# Patient Record
Sex: Female | Born: 1975
Health system: Southern US, Community
[De-identification: ages and names within clinical notes are randomized; demographics above are authoritative.]

## PROBLEM LIST (undated history)

## (undated) DIAGNOSIS — J310 Chronic rhinitis: Secondary | ICD-10-CM

## (undated) DIAGNOSIS — F319 Bipolar disorder, unspecified: Secondary | ICD-10-CM

## (undated) HISTORY — PX: TUBAL LIGATION: SHX77

## (undated) HISTORY — PX: THYROIDECTOMY, PARTIAL: SHX18

## (undated) HISTORY — DX: Bipolar disorder, unspecified: F31.9

## (undated) HISTORY — DX: Chronic rhinitis: J31.0

---

## 2004-07-08 ENCOUNTER — Inpatient Hospital Stay (HOSPITAL_COMMUNITY): Admission: AD | Admit: 2004-07-08 | Discharge: 2004-07-11 | Payer: Self-pay | Admitting: Obstetrics and Gynecology

## 2004-08-09 ENCOUNTER — Other Ambulatory Visit: Admission: RE | Admit: 2004-08-09 | Discharge: 2004-08-09 | Payer: Self-pay | Admitting: Obstetrics and Gynecology

## 2005-10-12 ENCOUNTER — Other Ambulatory Visit: Admission: RE | Admit: 2005-10-12 | Discharge: 2005-10-12 | Payer: Self-pay | Admitting: Obstetrics and Gynecology

## 2005-12-26 ENCOUNTER — Other Ambulatory Visit: Admission: RE | Admit: 2005-12-26 | Discharge: 2005-12-26 | Payer: Self-pay | Admitting: Endocrinology

## 2006-12-27 ENCOUNTER — Other Ambulatory Visit: Admission: RE | Admit: 2006-12-27 | Discharge: 2006-12-27 | Payer: Self-pay | Admitting: Obstetrics and Gynecology

## 2007-08-23 ENCOUNTER — Encounter: Admission: RE | Admit: 2007-08-23 | Discharge: 2007-08-23 | Payer: Self-pay | Admitting: Endocrinology

## 2007-09-17 ENCOUNTER — Encounter: Admission: RE | Admit: 2007-09-17 | Discharge: 2007-09-17 | Payer: Self-pay | Admitting: Obstetrics and Gynecology

## 2010-07-01 ENCOUNTER — Encounter: Payer: Self-pay | Admitting: Internal Medicine

## 2010-07-01 ENCOUNTER — Institutional Professional Consult (permissible substitution) (INDEPENDENT_AMBULATORY_CARE_PROVIDER_SITE_OTHER): Payer: BC Managed Care – PPO | Admitting: Internal Medicine

## 2010-07-01 DIAGNOSIS — J45909 Unspecified asthma, uncomplicated: Secondary | ICD-10-CM

## 2010-07-12 NOTE — Assessment & Plan Note (Signed)
Summary: CONSULT FOR ASTHMA//SH   Copy to:  Self Primary Provider/Referring Provider:  Ehinger  CC:  Dsypnea and Cough.  History of Present Illness: July 01, 2010- 35 yoF here with husband, concerned about persistent cough x 6 months. She describes raspy, nonproductive cough with chest tightness. It began with an acute infection 'chest cold", but never cleared. In November she saw Dr Manus Gunning who gave a rescue inhaler and antibiotic without clear benefit at that time. The inhaler now does seem to help some and is used at least once daily. The inhaler may have helped temporarily. She has some sense of chest congestion but not wheeze. Mucinex and loratadine did not help. Ears and nose ok. Dust and mold exposures might make cough a little worse. She is not noticing postnasal drainage or reflux/ heartburn, or choke during sleep or meals.  Hx asthma and allergic rhinitis as a child. Has lived here 6 years. While in [place] about 5 years ago, sublingual immunotherapy did help rhinitis with sensitivity to dyst and mold. Dog exposure seemed to make her wolrse and may have been included in the vaccine.  Her dog died 1 year ago. Minor Spring rhinitis. Denies dyspsnea and feels she has good exercise tolerance.   Asthma History    Initial Asthma Severity Rating:    Age range: 12+ years    Symptoms: daily    Nighttime Awakenings: 0-2/month    Interferes w/ normal activity: minor limitations    SABA use (not for EIB): daily    Asthma Severity Assessment: Moderate Persistent   Preventive Screening-Counseling & Management  Alcohol-Tobacco     Smoking Status: quit     Packs/Day: 0.5     Year Started: college     Year Quit: 2000  Current Medications (verified): 1)  Lamictal 100 Mg Tabs (Lamotrigine) .Marland Kitchen.. 1 Two Times A Day 2)  Seroquel Xr 50 Mg Xr24h-Tab (Quetiapine Fumarate) .... 1/2 Every Am As Needed and 1 At Bedtime 3)  Zoloft 100 Mg Tabs (Sertraline Hcl) .Marland Kitchen.. 1 Once Daily 4)  Clonazepam 0.5 Mg Tabs  (Clonazepam) .Marland Kitchen.. 1 Two Times A Day 5)  Proair Hfa 108 (90 Base) Mcg/act Aers (Albuterol Sulfate) .... 2 Puffs Four Times A Day As Needed  Allergies (verified): No Known Drug Allergies  Past History:  Family History: Last updated: 07/01/2010 Emphysema- PGF (smoker) Allergies- Mother Asthma- Mother and Father RA- Father Lung CA-   Social History: Last updated: 07/01/2010 Married Children Former smoker. Quit in 2000.   Auctioneer and Liquidation specialist   Risk Factors: Smoking Status: quit (07/01/2010) Packs/Day: 0.5 (07/01/2010)  Past Medical History: Asthma Rhinitis BiPolar  Past Surgical History: Partial Thyroidectomy  Family History: Emphysema- PGF (smoker) Allergies- Mother Asthma- Mother and Father RA- Father Lung CA-   Social History: Married Children Former smoker. Quit in 2000.   Auctioneer and Liquidation specialist Packs/Day:  0.5 Smoking Status:  quit  Review of Systems      See HPI       The patient complains of shortness of breath with activity, non-productive cough, irregular heartbeats, sore throat, anxiety, and depression.  The patient denies shortness of breath at rest, productive cough, coughing up blood, chest pain, acid heartburn, indigestion, loss of appetite, weight change, abdominal pain, difficulty swallowing, tooth/dental problems, headaches, nasal congestion/difficulty breathing through nose, sneezing, itching, ear ache, hand/feet swelling, joint stiffness or pain, rash, change in color of mucus, and fever.    Vital Signs:  Patient profile:   35 year old female  Height:      64 inches Weight:      133 pounds BMI:     22.91 O2 Sat:      99 % on Room air Pulse rate:   80 / minute BP sitting:   108 / 72  (left arm)  Vitals Entered By: Vernie Murders (July 01, 2010 10:37 AM)  O2 Flow:  Room air  Physical Exam  Additional Exam:  General: A/Ox3; pleasant and cooperative, NAD, wdwn SKIN: no rash, lesions NODES: no  lymphadenopathy HEENT: [place]/AT, EOM- WNL, Conjuctivae- clear, PERRLA, TM-WNL, Nose- clear, Throat- clear and wnl, Mallampati  II NECK: Supple w/ fair ROM, JVD- none, normal carotid impulses w/o bruits Thyroid- normal to palpation CHEST: Clear to P&A, no cough, wheeze, rhonchi or increased work of breathing HEART: RRR, no m/g/r heard ABDOMEN: Soft and nl; nml bowel sounds; no organomegaly or masses noted ZHY:QMVH, nl pulses, no edema  NEURO: Grossly intact to observation      Impression & Recommendations:  Problem # 1:  ASTHMA (ICD-493.90) We will try to give her a fast fix with a Zpak and sample Symbicort, chosen for anitinflammatory effect.  This has lasted longer than the usual post-viral bronchits. We aren't yet sure if this is part of a longer trend with resumption of her asthma., or a persistent but correctable temporary issue.   Time was spent with her and her husband to discuss differential considerations of sustaining rhinitis, reflux or asthma, and to compare treatment and diagnostic options.   Medications Added to Medication List This Visit: 1)  Lamictal 100 Mg Tabs (Lamotrigine) .Marland Kitchen.. 1 two times a day 2)  Seroquel Xr 50 Mg Xr24h-tab (Quetiapine fumarate) .... 1/2 every am as needed and 1 at bedtime 3)  Zoloft 100 Mg Tabs (Sertraline hcl) .Marland Kitchen.. 1 once daily 4)  Clonazepam 0.5 Mg Tabs (Clonazepam) .Marland Kitchen.. 1 two times a day 5)  Proair Hfa 108 (90 Base) Mcg/act Aers (Albuterol sulfate) .... 2 puffs four times a day as needed 6)  Symbicort 160-4.5 Mcg/act Aero (Budesonide-formoterol fumarate) .... 2 puffs and rinse mouth, twice daily 7)  Zithromax Z-pak 250 Mg Tabs (Azithromycin) .... 2 today then one daily  Other Orders: Consultation Level IV (84696)  Patient Instructions: 1)  Please schedule a follow-up appointment in 1 month. 2)  Sample/ script Symbicort 160-4.5 3)      2 puffs and rinse mouth, twice daily 4)  Script Z pak  5)  Refill script Proair Prescriptions: ZITHROMAX  Z-PAK 250 MG TABS (AZITHROMYCIN) 2 today then one daily  #1 pak x 0   Entered and Authorized by:   Waymon Budge MD   Signed by:   Waymon Budge MD on 07/01/2010   Method used:   Print then Give to Patient   RxID:   [id number] PROAIR HFA 108 (90 BASE) MCG/ACT AERS (ALBUTEROL SULFATE) 2 puffs four times a day as needed  #1 x prn   Entered and Authorized by:   Waymon Budge MD   Signed by:   Waymon Budge MD on 07/01/2010   Method used:   Print then Give to Patient   RxID:   [id number] SYMBICORT 160-4.5 MCG/ACT AERO (BUDESONIDE-FORMOTEROL FUMARATE) 2 puffs and rinse mouth, twice daily  #1 x prn   Entered and Authorized by:   Waymon Budge MD   Signed by:   Waymon Budge MD on 07/01/2010   Method used:   Print then Give to Patient  RxID:   [id number]

## 2010-07-12 NOTE — Medication Information (Signed)
Summary: Request for Z-pack/[facility]  Request for Z-pack/[facility]   Imported By: Sherian Rein 07/07/2010 07:25:35  _____________________________________________________________________  External Attachment:    Type:   Image     Comment:   External Document

## 2010-08-10 ENCOUNTER — Encounter: Payer: Self-pay | Admitting: Internal Medicine

## 2010-08-12 ENCOUNTER — Encounter: Payer: Self-pay | Admitting: Internal Medicine

## 2010-08-12 ENCOUNTER — Ambulatory Visit (INDEPENDENT_AMBULATORY_CARE_PROVIDER_SITE_OTHER): Payer: BC Managed Care – PPO | Admitting: Internal Medicine

## 2010-08-12 VITALS — BP 120/90 | HR 102 | Ht 64.0 in | Wt 128.6 lb

## 2010-08-12 DIAGNOSIS — J45909 Unspecified asthma, uncomplicated: Secondary | ICD-10-CM

## 2010-08-12 MED ORDER — METHYLPREDNISOLONE ACETATE 40 MG/ML IJ SUSP
40.0000 mg | Freq: Once | INTRAMUSCULAR | Status: AC
  Administered 2010-08-12: 40 mg via INTRAMUSCULAR

## 2010-08-12 MED ORDER — LEVALBUTEROL HCL 0.63 MG/3ML IN NEBU
0.6300 mg | INHALATION_SOLUTION | Freq: Once | RESPIRATORY_TRACT | Status: AC
  Administered 2010-08-12: 0.63 mg via RESPIRATORY_TRACT

## 2010-08-12 MED ORDER — BUDESONIDE-FORMOTEROL FUMARATE 80-4.5 MCG/ACT IN AERO: 2.0000 | INHALATION_SPRAY | Freq: Two times a day (BID) | RESPIRATORY_TRACT | Status: DC

## 2010-08-12 MED ORDER — IPRATROPIUM BROMIDE HFA 17 MCG/ACT IN AERS: 2.0000 | INHALATION_SPRAY | Freq: Four times a day (QID) | RESPIRATORY_TRACT | Status: AC

## 2010-08-12 NOTE — Progress Notes (Signed)
  Subjective:    Patient ID: Elizabeth Carey, female    DOB: [birth year], 34 y.o.   MRN: [id number]  HPI 21 yoF former smoker, followed for asthma. Last here July 01, 2010 for initial visit. 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 In last few days chest is more congested, cough more. Daughter has a viral syndrome. Proair over stimulates. She feels under work pressure, and I gather her anxiety about job performance is important to her sense of asthma control at this time. .    Review of Systems See HPI Constitutional:   No weight loss, night sweats,  Fevers, chills, fatigue, lassitude. HEENT:   No headaches,  Difficulty swallowing,  Tooth/dental problems,  Sore throat,                No sneezing, itching, ear ache, nasal congestion, post nasal drip,   CV:  No chest pain,  Orthopnea, PND, swelling in lower extremities, anasarca, dizziness, palpitations  GI  No heartburn, indigestion, abdominal pain, nausea, vomiting, diarrhea, change in bowel habits, loss of appetite  Resp  Some shortness of breath with exertion,  not at rest.  No excess mucus, no productive cough,  No non-productive cough,  No coughing up of blood.  No change in color of mucus.  No wheezing.  No chest wall deformity  Skin: no rash or lesions.  GU: no dysuria, change in color of urine, no urgency or frequency.  No flank pain.  MS:  No joint pain or swelling.  No decreased range of motion.  No back pain.  Psych:  No change in mood or affect. No depression or anxiety.  No memory loss.      Objective:   Physical Exam General- Alert, Oriented, Affect-appropriate, Distress- none acute.          Anxious  Skin- rash-none, lesions- none, excoriation- none  Lymphadenopathy- none  Head- atraumatic  Eyes- Gross vision intact, PERRLA, conjunctivae clear secretions  Ears- Hearing normal  canals, Tm L, R ,  Nose- Clear, No- septal dev, mucus, polyps, erosion, perforation    Throat- Mallampati II , mucosa clear , drainage- none, tonsils- atrophic  Neck- flexible , trachea midline, no stridor , thyroid nl, carotid no bruit  Chest - symmetrical excursion , unlabored     Heart/CV- RRR , no murmur , no gallop  , no rub, nl s1 s2                     - JVD- none , edema- none, stasis changes- none, varices- none     Lung- clear to P&A, wheeze- none, cough- none , dullness-none, rub- none     Chest wall- Abd- tender-no, distended-no, bowel sounds-present, HSM- no  Br/ Gen/ Rectal- Not done, not indicated  Extrem- cyanosis- none, clubbing, none, atrophy- none, strength- nl  Neuro- grossly intact to observation         Assessment & Plan:

## 2010-08-12 NOTE — Assessment & Plan Note (Signed)
Will treat as an asthmatic bronchitis exacerbation- need to minimize stimulant effect. She needs reassurance that she can perform at work.

## 2010-08-12 NOTE — Patient Instructions (Addendum)
Neb xop 0.63  Depo 40  Script for Atrovent HFA rescue inhaler to use instead of Proair--sent  Script to stay on Symbicort 80-4.5   Maintenance--sent

## 2010-08-18 ENCOUNTER — Encounter: Payer: Self-pay | Admitting: Internal Medicine

## 2010-09-28 ENCOUNTER — Telehealth: Payer: Self-pay | Admitting: Internal Medicine

## 2010-09-28 NOTE — Telephone Encounter (Signed)
PT WANTED SOMEONE TO CHECK HER BEFORE THE WEEKEND BUT NEEDED APPT EARLY AM--PT FINE WITH SEEING TP AND SHE HAS A F/U VISIT WITH CY IN July--APPT SCHEDULED WITH TP Thursday 5/31 AT 9AM

## 2010-09-29 ENCOUNTER — Encounter: Payer: Self-pay | Admitting: Adult Health

## 2010-09-29 ENCOUNTER — Ambulatory Visit (INDEPENDENT_AMBULATORY_CARE_PROVIDER_SITE_OTHER): Payer: BC Managed Care – PPO | Admitting: Adult Health

## 2010-09-29 VITALS — BP 106/74 | HR 76 | Temp 99.9°F | Ht 64.0 in | Wt 124.2 lb

## 2010-09-29 DIAGNOSIS — J45909 Unspecified asthma, uncomplicated: Secondary | ICD-10-CM

## 2010-09-29 MED ORDER — AZITHROMYCIN 250 MG PO TABS: 250.0000 mg | ORAL_TABLET | Freq: Every day | ORAL | Status: AC

## 2010-09-29 NOTE — Progress Notes (Signed)
  Subjective:    Patient ID: Elizabeth Carey, female    DOB: 1975-11-29, 35 y.o.   MRN: [id number]  HPI 13 yoF former smoker, followed for asthma.  08/22/10--Acute OV  Last here July 01, 2010 for initial visit. 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 In last few days chest is more congested, cough more. Daughter has a viral syndrome. Proair over stimulates. She feels under work pressure, and I gather her anxiety about job performance is important to her sense of asthma control at this time. . >>tx w/ depo and xopnex neb  09/29/10 Acute OV  Complains of prod cough with yellow mucus, wheezing, increased SOB x 2 weeks. Started with cold like symptoms with drainage then cough started. Coughing up thick yellow mucus. Some wheezing. NO otc used. NO hemoptysis Feels she has mucus stuck in bottom of lungs.   She is some confused on use of atrovent. Has been using on daily basis. Did not understand this was to replace proair as rescue inhaler. Is concerned that symbicort may be causing some jitteriness.       Review of Systems Constitutional:   No  weight loss, night sweats,   +Fevers, chills, fatigue, or  lassitude.  HEENT:   No headaches,  Difficulty swallowing,  Tooth/dental problems, or  Sore throat,                No sneezing, itching, ear ache, nasal congestion, post nasal drip,   CV:  No chest pain,  Orthopnea, PND, swelling in lower extremities, anasarca, dizziness, palpitations, syncope.   GI  No heartburn, indigestion, abdominal pain, nausea, vomiting, diarrhea, change in bowel habits, loss of appetite, bloody stools.   Resp:    ,  No coughing up of blood.   Marland Kitchen     No chest wall deformity  Skin: no rash or lesions.  GU: no dysuria, change in color of urine, no urgency or frequency.  No flank pain, no hematuria   MS:  No joint pain or swelling.  No decreased range of motion.  No back pain.  Psych:    No memory loss.           Objective:   Physical Exam GEN: A/Ox3; pleasant , NAD, well nourished   HEENT:  [place]/AT,  EACs-clear, TMs-wnl, NOSE-clear, THROAT-clear, no lesions, no postnasal drip or exudate noted.   NECK:  Supple w/ fair ROM; no JVD; normal carotid impulses w/o bruits; no thyromegaly or nodules palpated; no lymphadenopathy.  RESP  Coarse BS w/ faint exp wheeze. no accessory muscle use, no dullness to percussion  CARD:  RRR, no m/r/g  , no peripheral edema, pulses intact, no cyanosis or clubbing.  GI:   Soft & nt; nml bowel sounds; no organomegaly or masses detected.  Musco: Warm bil, no deformities or joint swelling noted.   Neuro: alert, no focal deficits noted.    Skin: Warm, no lesions or rashes         Assessment & Plan:

## 2010-09-29 NOTE — Patient Instructions (Signed)
ZPack take as directed.  Mucinex DM Twice daily  As needed  Cough/congestion  Fluids and rest  Take Symbicort 80/4.26mcg 2 puffs Twice daily  -  Use Atrovent only as rescue As needed  For wheezing or shortness of breath.  follow up in 6 weeks Dr. Maple Hudson  As planned and As needed   Please contact office for sooner follow up if symptoms do not improve or worsen or seek emergency care

## 2010-09-29 NOTE — Assessment & Plan Note (Addendum)
Mild flare with URI  She declined xopenex neb (2/2 nervousness) Plan:  ZPack take as directed.  Mucinex DM Twice daily  As needed  Cough/congestion  Fluids and rest  Take Symbicort 80/4.48mcg 2 puffs Twice daily  -  Use Atrovent only as rescue As needed  For wheezing or shortness of breath.  follow up in 6 weeks Dr. Maple Hudson  As planned and As needed   Please contact office for sooner follow up if symptoms do not improve or worsen or seek emergency care

## 2010-11-11 ENCOUNTER — Ambulatory Visit: Payer: BC Managed Care – PPO | Admitting: Internal Medicine

## 2011-03-29 ENCOUNTER — Emergency Department (INDEPENDENT_AMBULATORY_CARE_PROVIDER_SITE_OTHER)
Admission: EM | Admit: 2011-03-29 | Discharge: 2011-03-29 | Disposition: A | Discharge status: Home / Self Care | Payer: BC Managed Care – PPO | Source: Home / Self Care | Attending: Family Medicine | Admitting: Family Medicine

## 2011-03-29 ENCOUNTER — Encounter (HOSPITAL_COMMUNITY): Payer: Self-pay

## 2011-03-29 DIAGNOSIS — J329 Chronic sinusitis, unspecified: Secondary | ICD-10-CM

## 2011-03-29 HISTORY — DX: Bipolar disorder, unspecified: F31.9

## 2011-03-29 MED ORDER — PREDNISONE 20 MG PO TABS: ORAL_TABLET | ORAL | Status: AC

## 2011-03-29 MED ORDER — FEXOFENADINE-PSEUDOEPHED ER 60-120 MG PO TB12: 1.0000 | ORAL_TABLET | Freq: Two times a day (BID) | ORAL | Status: AC

## 2011-03-29 MED ORDER — AZITHROMYCIN 250 MG PO TABS: 250.0000 mg | ORAL_TABLET | Freq: Every day | ORAL | Status: AC

## 2011-03-29 NOTE — ED Notes (Signed)
States both of her children are ill and on antibiotics; she has reportedly been having cough, sinus pressure, yellow -grey secretions, ear discomfort; unable to ascultate wheezing or rales

## 2011-03-29 NOTE — ED Provider Notes (Signed)
History     CSN: [id number] Arrival date & time: 03/29/2011  9:49 AM   First MD Initiated Contact with Patient 03/29/11 1058      Chief Complaint  Patient presents with  . URI    (Consider location/radiation/quality/duration/timing/severity/associated sxs/prior treatment) HPI Comments: 3 days with nasal congestion thick yellow grey discharge and sinus pressure. Temperature 100.2 last night. 2 young children at home one diagnosed with bronchitis another with sinusitis this week both on antibiotics. No positive strep tests.  Patient is a 35 y.o. female presenting with URI. The history is provided by the patient.  URI The primary symptoms include fever, headaches, sore throat and cough. Primary symptoms do not include wheezing, abdominal pain, nausea, vomiting, myalgias, arthralgias or rash. Primary symptoms comment: sinus pressure, thick nasal discharge.  The sore throat is not accompanied by trouble swallowing.  Symptoms associated with the illness include congestion and rhinorrhea.    Past Medical History  Diagnosis Date  . Asthma   . Rhinitis   . Bipolar affective disorder   . Bipolar disorder     Past Surgical History  Procedure Date  . Thyroidectomy, partial   . Tubal ligation     Family History  Problem Relation Age of Onset  . Emphysema Paternal Grandfather     smoker  . Allergies Mother   . Asthma Mother   . Asthma Father   . Rheum arthritis Father   . Lung cancer      History  Substance Use Topics  . Smoking status: Former Smoker -- 0.5 packs/day for 5 years    Types: Cigarettes    Quit date: 05/01/1998  . Smokeless tobacco: Not on file  . Alcohol Use: Yes    OB History    Grav Para Term Preterm Abortions TAB SAB Ect Mult Living                  Review of Systems  Constitutional: Positive for fever. Negative for activity change and appetite change.  HENT: Positive for congestion, sore throat and rhinorrhea. Negative for trouble swallowing.     Eyes: Negative for discharge.  Respiratory: Positive for cough. Negative for shortness of breath and wheezing.   Gastrointestinal: Negative for nausea, vomiting and abdominal pain.  Musculoskeletal: Negative for myalgias and arthralgias.  Skin: Negative for rash.  Neurological: Positive for headaches.    Allergies  Mold extract  Home Medications   Current Outpatient Rx  Name Route Sig Dispense Refill  . AZITHROMYCIN 250 MG PO TABS Oral Take 1 tablet (250 mg total) by mouth daily. Take first 2 tablets together, then 1 every day until finished. 6 tablet 0  . BUDESONIDE-FORMOTEROL FUMARATE 80-4.5 MCG/ACT IN AERO Inhalation Inhale 2 puffs into the lungs 2 (two) times daily. Rinse mouth 1 Inhaler 12  . CLONAZEPAM 0.5 MG PO TABS Oral Take 0.5 mg by mouth 2 (two) times daily as needed.     Marland Kitchen FEXOFENADINE-PSEUDOEPHEDRINE 60-120 MG PO TB12 Oral Take 1 tablet by mouth every 12 (twelve) hours. 30 tablet 0  . OMEGA-3 FATTY ACIDS 1000 MG PO CAPS Oral Take 2 g by mouth 2 (two) times a week.      . IPRATROPIUM BROMIDE HFA 17 MCG/ACT IN AERS Inhalation Inhale 2 puffs into the lungs 4 (four) times daily. As needed rescue inhaler 1 Inhaler 2  . LAMOTRIGINE 100 MG PO TABS Oral Take 100 mg by mouth 2 (two) times daily.      Marland Kitchen LORATADINE 10 MG PO  TABS Oral Take 10 mg by mouth daily.      . MULTIVITAMINS PO CAPS Oral Take 1 capsule by mouth daily.      Marland Kitchen PREDNISONE 20 MG PO TABS  2 tabs po qd for 5 days 10 tablet no  . QUETIAPINE FUMARATE 50 MG PO TABS  1/2 tablet every am as needed and 1 at bedtime     . SERTRALINE HCL 100 MG PO TABS Oral Take 100 mg by mouth daily.        BP 123/76  Pulse 86  Temp(Src) 98.1 F (36.7 C) (Oral)  Resp 18  SpO2 100%  Physical Exam  Nursing note and vitals reviewed. Constitutional: She is oriented to person, place, and time. She appears well-developed and well-nourished. No distress.  HENT:  Head: Normocephalic.  Right Ear: Tympanic membrane, external ear and ear  canal normal.  Left Ear: Tympanic membrane, external ear and ear canal normal.  Nose: Mucosal edema and rhinorrhea present. Right sinus exhibits maxillary sinus tenderness. Right sinus exhibits no frontal sinus tenderness. Left sinus exhibits maxillary sinus tenderness. Left sinus exhibits no frontal sinus tenderness.  Mouth/Throat: Uvula is midline and mucous membranes are normal. Posterior oropharyngeal erythema present. No oropharyngeal exudate, posterior oropharyngeal edema or tonsillar abscesses.  Eyes: Conjunctivae are normal.  Neck: Normal range of motion.  Cardiovascular: Normal rate, regular rhythm and normal heart sounds.   Pulmonary/Chest: Effort normal and breath sounds normal. No respiratory distress. She has no wheezes. She has no rales. She exhibits no tenderness.  Lymphadenopathy:    She has no cervical adenopathy.  Neurological: She is alert and oriented to person, place, and time.  Skin: No rash noted.    ED Course  Procedures (including critical care time)  Labs Reviewed - No data to display No results found.   1. Sinusitis       MDM  Treated with azithromycin, prednisone, decongestant and antihistamine.Sharin Grave, MD 03/30/11 1526

## 2011-05-05 ENCOUNTER — Other Ambulatory Visit (HOSPITAL_COMMUNITY)
Admission: RE | Admit: 2011-05-05 | Discharge: 2011-05-05 | Disposition: A | Discharge status: Home / Self Care | Payer: BC Managed Care – PPO | Source: Ambulatory Visit | Attending: Obstetrics and Gynecology | Admitting: Obstetrics and Gynecology

## 2011-05-05 ENCOUNTER — Other Ambulatory Visit: Payer: Self-pay | Admitting: Obstetrics and Gynecology

## 2011-05-05 DIAGNOSIS — Z113 Encounter for screening for infections with a predominantly sexual mode of transmission: Secondary | ICD-10-CM | POA: Insufficient documentation

## 2011-05-05 DIAGNOSIS — Z01419 Encounter for gynecological examination (general) (routine) without abnormal findings: Secondary | ICD-10-CM | POA: Insufficient documentation

## 2011-08-23 ENCOUNTER — Other Ambulatory Visit: Payer: Self-pay | Admitting: Internal Medicine

## 2012-07-24 ENCOUNTER — Other Ambulatory Visit (HOSPITAL_COMMUNITY)
Admission: RE | Admit: 2012-07-24 | Discharge: 2012-07-24 | Disposition: A | Discharge status: Home / Self Care | Payer: BC Managed Care – PPO | Source: Ambulatory Visit | Attending: Obstetrics and Gynecology | Admitting: Obstetrics and Gynecology

## 2012-07-24 ENCOUNTER — Other Ambulatory Visit: Payer: Self-pay | Admitting: Obstetrics and Gynecology

## 2012-07-24 DIAGNOSIS — Z1151 Encounter for screening for human papillomavirus (HPV): Secondary | ICD-10-CM | POA: Insufficient documentation

## 2012-07-24 DIAGNOSIS — Z01419 Encounter for gynecological examination (general) (routine) without abnormal findings: Secondary | ICD-10-CM | POA: Insufficient documentation

## 2015-02-25 ENCOUNTER — Other Ambulatory Visit: Payer: Self-pay

## 2015-02-25 DIAGNOSIS — Z1231 Encounter for screening mammogram for malignant neoplasm of breast: Secondary | ICD-10-CM

## 2015-03-11 ENCOUNTER — Ambulatory Visit
Admission: RE | Admit: 2015-03-11 | Discharge: 2015-03-11 | Disposition: A | Discharge status: Home / Self Care | Payer: BLUE CROSS/BLUE SHIELD | Source: Ambulatory Visit

## 2015-03-11 DIAGNOSIS — Z1231 Encounter for screening mammogram for malignant neoplasm of breast: Secondary | ICD-10-CM

## 2016-05-23 ENCOUNTER — Other Ambulatory Visit: Payer: Self-pay | Admitting: Obstetrics and Gynecology

## 2016-05-23 DIAGNOSIS — Z1231 Encounter for screening mammogram for malignant neoplasm of breast: Secondary | ICD-10-CM

## 2016-06-12 ENCOUNTER — Ambulatory Visit: Payer: BLUE CROSS/BLUE SHIELD

## 2016-06-15 ENCOUNTER — Ambulatory Visit (INDEPENDENT_AMBULATORY_CARE_PROVIDER_SITE_OTHER): Payer: 59 | Admitting: Psychology

## 2016-06-15 DIAGNOSIS — F4323 Adjustment disorder with mixed anxiety and depressed mood: Secondary | ICD-10-CM

## 2016-06-27 ENCOUNTER — Ambulatory Visit
Admission: RE | Admit: 2016-06-27 | Discharge: 2016-06-27 | Disposition: A | Discharge status: Home / Self Care | Payer: BLUE CROSS/BLUE SHIELD | Source: Ambulatory Visit | Attending: Obstetrics and Gynecology | Admitting: Obstetrics and Gynecology

## 2016-06-27 DIAGNOSIS — Z1231 Encounter for screening mammogram for malignant neoplasm of breast: Secondary | ICD-10-CM

## 2016-07-13 ENCOUNTER — Ambulatory Visit: Payer: 59 | Admitting: Psychology

## 2016-07-31 ENCOUNTER — Ambulatory Visit: Payer: 59 | Admitting: Psychology

## 2016-08-25 ENCOUNTER — Ambulatory Visit (INDEPENDENT_AMBULATORY_CARE_PROVIDER_SITE_OTHER): Payer: BLUE CROSS/BLUE SHIELD | Admitting: Psychology

## 2016-08-25 DIAGNOSIS — F330 Major depressive disorder, recurrent, mild: Secondary | ICD-10-CM | POA: Diagnosis not present

## 2016-10-09 ENCOUNTER — Ambulatory Visit (INDEPENDENT_AMBULATORY_CARE_PROVIDER_SITE_OTHER): Payer: BLUE CROSS/BLUE SHIELD | Admitting: Psychology

## 2016-10-09 DIAGNOSIS — F330 Major depressive disorder, recurrent, mild: Secondary | ICD-10-CM

## 2016-12-04 ENCOUNTER — Ambulatory Visit (INDEPENDENT_AMBULATORY_CARE_PROVIDER_SITE_OTHER): Payer: BLUE CROSS/BLUE SHIELD | Admitting: Psychology

## 2016-12-04 DIAGNOSIS — F330 Major depressive disorder, recurrent, mild: Secondary | ICD-10-CM | POA: Diagnosis not present

## 2017-01-17 ENCOUNTER — Ambulatory Visit: Payer: Self-pay | Admitting: Psychology

## 2017-02-15 ENCOUNTER — Ambulatory Visit (INDEPENDENT_AMBULATORY_CARE_PROVIDER_SITE_OTHER): Payer: BLUE CROSS/BLUE SHIELD | Admitting: Psychology

## 2017-02-15 DIAGNOSIS — F330 Major depressive disorder, recurrent, mild: Secondary | ICD-10-CM | POA: Diagnosis not present

## 2017-03-29 ENCOUNTER — Ambulatory Visit (INDEPENDENT_AMBULATORY_CARE_PROVIDER_SITE_OTHER): Payer: BLUE CROSS/BLUE SHIELD | Admitting: Psychology

## 2017-03-29 DIAGNOSIS — F330 Major depressive disorder, recurrent, mild: Secondary | ICD-10-CM

## 2017-05-02 ENCOUNTER — Other Ambulatory Visit: Payer: Self-pay | Admitting: Obstetrics and Gynecology

## 2017-05-02 ENCOUNTER — Other Ambulatory Visit (HOSPITAL_COMMUNITY)
Admission: RE | Admit: 2017-05-02 | Discharge: 2017-05-02 | Disposition: A | Discharge status: Home / Self Care | Payer: BLUE CROSS/BLUE SHIELD | Source: Ambulatory Visit | Attending: Obstetrics and Gynecology | Admitting: Obstetrics and Gynecology

## 2017-05-02 DIAGNOSIS — Z124 Encounter for screening for malignant neoplasm of cervix: Secondary | ICD-10-CM | POA: Diagnosis present

## 2017-05-03 LAB — CYTOLOGY - PAP
Diagnosis: NEGATIVE
HPV (WINDOPATH): NOT DETECTED

## 2017-05-24 ENCOUNTER — Ambulatory Visit: Payer: BLUE CROSS/BLUE SHIELD | Admitting: Psychology

## 2017-07-04 ENCOUNTER — Ambulatory Visit (INDEPENDENT_AMBULATORY_CARE_PROVIDER_SITE_OTHER): Payer: BLUE CROSS/BLUE SHIELD | Admitting: Psychology

## 2017-07-04 DIAGNOSIS — F330 Major depressive disorder, recurrent, mild: Secondary | ICD-10-CM

## 2017-07-16 ENCOUNTER — Ambulatory Visit (HOSPITAL_COMMUNITY): Payer: Self-pay | Admitting: Psychiatry

## 2017-07-23 ENCOUNTER — Ambulatory Visit (INDEPENDENT_AMBULATORY_CARE_PROVIDER_SITE_OTHER): Payer: BLUE CROSS/BLUE SHIELD | Admitting: Psychiatry

## 2017-07-23 ENCOUNTER — Encounter (HOSPITAL_COMMUNITY): Payer: Self-pay | Admitting: Psychiatry

## 2017-07-23 DIAGNOSIS — Z818 Family history of other mental and behavioral disorders: Secondary | ICD-10-CM

## 2017-07-23 DIAGNOSIS — R450 Nervousness: Secondary | ICD-10-CM

## 2017-07-23 DIAGNOSIS — Z87891 Personal history of nicotine dependence: Secondary | ICD-10-CM

## 2017-07-23 DIAGNOSIS — F310 Bipolar disorder, current episode hypomanic: Secondary | ICD-10-CM

## 2017-07-23 DIAGNOSIS — F419 Anxiety disorder, unspecified: Secondary | ICD-10-CM | POA: Diagnosis not present

## 2017-07-23 MED ORDER — QUETIAPINE FUMARATE ER 300 MG PO TB24: 300.0000 mg | ORAL_TABLET | Freq: Every day | ORAL | 1 refills | Status: DC

## 2017-07-23 MED ORDER — CLONAZEPAM 0.5 MG PO TABS: 0.5000 mg | ORAL_TABLET | Freq: Every day | ORAL | 0 refills | Status: DC

## 2017-07-23 MED ORDER — AMPHETAMINE-DEXTROAMPHET ER 5 MG PO CP24: 5.0000 mg | ORAL_CAPSULE | Freq: Every day | ORAL | 0 refills | Status: DC

## 2017-07-23 MED ORDER — LAMOTRIGINE 150 MG PO TABS: 150.0000 mg | ORAL_TABLET | Freq: Two times a day (BID) | ORAL | 1 refills | Status: DC
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
## 2017-08-02 ENCOUNTER — Ambulatory Visit (INDEPENDENT_AMBULATORY_CARE_PROVIDER_SITE_OTHER): Payer: BLUE CROSS/BLUE SHIELD | Admitting: Psychology

## 2017-08-02 DIAGNOSIS — F3181 Bipolar II disorder: Secondary | ICD-10-CM | POA: Diagnosis not present

## 2017-08-09 ENCOUNTER — Ambulatory Visit (INDEPENDENT_AMBULATORY_CARE_PROVIDER_SITE_OTHER): Payer: BLUE CROSS/BLUE SHIELD | Admitting: Psychology

## 2017-08-09 DIAGNOSIS — F330 Major depressive disorder, recurrent, mild: Secondary | ICD-10-CM

## 2017-09-04 ENCOUNTER — Ambulatory Visit (INDEPENDENT_AMBULATORY_CARE_PROVIDER_SITE_OTHER): Payer: BLUE CROSS/BLUE SHIELD | Admitting: Psychology

## 2017-09-04 DIAGNOSIS — F330 Major depressive disorder, recurrent, mild: Secondary | ICD-10-CM | POA: Diagnosis not present

## 2017-09-19 ENCOUNTER — Ambulatory Visit (HOSPITAL_COMMUNITY): Payer: BLUE CROSS/BLUE SHIELD | Admitting: Psychiatry

## 2017-10-02 ENCOUNTER — Ambulatory Visit: Payer: BLUE CROSS/BLUE SHIELD | Admitting: Psychology

## 2017-10-08 ENCOUNTER — Ambulatory Visit (HOSPITAL_COMMUNITY): Payer: BLUE CROSS/BLUE SHIELD | Admitting: Psychiatry

## 2017-10-22 ENCOUNTER — Ambulatory Visit (HOSPITAL_COMMUNITY): Payer: BLUE CROSS/BLUE SHIELD | Admitting: Psychiatry

## 2017-10-22 ENCOUNTER — Encounter (HOSPITAL_COMMUNITY): Payer: Self-pay | Admitting: Psychiatry

## 2017-10-22 VITALS — BP 122/76 | HR 86 | Ht 63.0 in | Wt 127.0 lb

## 2017-10-22 DIAGNOSIS — F902 Attention-deficit hyperactivity disorder, combined type: Secondary | ICD-10-CM

## 2017-10-22 DIAGNOSIS — F310 Bipolar disorder, current episode hypomanic: Secondary | ICD-10-CM | POA: Diagnosis not present

## 2017-10-22 MED ORDER — QUETIAPINE FUMARATE ER 300 MG PO TB24: 300.0000 mg | ORAL_TABLET | Freq: Every day | ORAL | 1 refills | Status: AC

## 2017-10-22 MED ORDER — LAMOTRIGINE 150 MG PO TABS: 150.0000 mg | ORAL_TABLET | Freq: Two times a day (BID) | ORAL | 1 refills | Status: AC

## 2017-10-22 MED ORDER — AMPHETAMINE-DEXTROAMPHET ER 5 MG PO CP24: 5.0000 mg | ORAL_CAPSULE | Freq: Every day | ORAL | 0 refills | Status: AC

## 2017-10-22 MED ORDER — CLONAZEPAM 0.5 MG PO TABS: 0.5000 mg | ORAL_TABLET | Freq: Every day | ORAL | 0 refills | Status: AC

## 2017-10-22 NOTE — Progress Notes (Signed)
BH MD/PA/NP OP Progress Note  10/22/2017 5:33 PM Elizabeth Carey  MRN:  [id number]  Chief Complaint: med management HPI: Elizabeth Carey presents with stable mood, and stable concentration. She is sleeping consistently and reports that she is handling stressors in her life fairly well. Reports things at home with family are good overall.  Continues in therapy with Misty StanleyLisa.  Spent time processing some of her recent stressors and she is able to weigh pros and cons, and participate well in a discussion of her mood and psyche.  Disclosed to patient that this Clinical research associatewriter is leaving this practice at the end of August 2019, and patients always has the right to choose their provider. Reassured patient that office will work to provide smooth transition of care whether they wish to remain at this office, or to continue with this provider, or seek alternative care options in community.  They expressed understanding.  Visit Diagnosis:    ICD-10-CM   1. Bipolar affective disorder, current episode hypomanic (HCC) F31.0 lamoTRIgine (LAMICTAL) 150 MG tablet    QUEtiapine (SEROQUEL XR) 300 MG 24 hr tablet    clonazePAM (KLONOPIN) 0.5 MG tablet  2. Attention deficit hyperactivity disorder (ADHD), combined type F90.2 amphetamine-dextroamphetamine (ADDERALL XR) 5 MG 24 hr capsule    amphetamine-dextroamphetamine (ADDERALL XR) 5 MG 24 hr capsule    amphetamine-dextroamphetamine (ADDERALL XR) 5 MG 24 hr capsule    Past Psychiatric History: See intake H&P for full details. Reviewed, with no updates at this time.   Past Medical History:  Past Medical History:  Diagnosis Date  . Asthma   . Bipolar affective disorder (HCC)   . Bipolar disorder (HCC)   . Rhinitis     Past Surgical History:  Procedure Laterality Date  . THYROIDECTOMY, PARTIAL    . TUBAL LIGATION      Family Psychiatric History: See intake H&P for full details. Reviewed, with no updates at this time.   Family History:  Family History   Problem Relation Age of Onset  . Emphysema Paternal Grandfather        smoker  . Allergies Mother   . Asthma Mother   . Asthma Father   . Rheum arthritis Father   . Lung cancer Unknown     Social History:  Social History   Socioeconomic History  . Marital status: Married    Spouse name: Not on file  . Number of children: Not on file  . Years of education: Not on file  . Highest education level: Not on file  Occupational History  . Occupation: Warehouse managerAuctioneer and Liquidatoin Specialist  Social Needs  . Financial resource strain: Not on file  . Food insecurity:    Worry: Not on file    Inability: Not on file  . Transportation needs:    Medical: Not on file    Non-medical: Not on file  Tobacco Use  . Smoking status: Former Smoker    Packs/day: 0.50    Years: 5.00    Pack years: 2.50    Types: Cigarettes    Last attempt to quit: 05/01/1998    Years since quitting: 19.4  . Smokeless tobacco: Never Used  Substance and Sexual Activity  . Alcohol use: Yes  . Drug use: Never  . Sexual activity: Not on file  Lifestyle  . Physical activity:    Days per week: Not on file    Minutes per session: Not on file  . Stress: Not on file  Relationships  . Social connections:  Talks on phone: Not on file    Gets together: Not on file    Attends religious service: Not on file    Active member of club or organization: Not on file    Attends meetings of clubs or organizations: Not on file    Relationship status: Not on file  Other Topics Concern  . Not on file  Social History Narrative  . Not on file    Allergies:  Allergies  Allergen Reactions  . Mold Extract [Trichophyton Mentagrophyte]     Metabolic Disorder Labs: No results found for: HGBA1C, MPG No results found for: PROLACTIN No results found for: CHOL, TRIG, HDL, CHOLHDL, VLDL, LDLCALC No results found for: TSH  Therapeutic Level Labs: No results found for: LITHIUM No results found for: VALPROATE No components  found for:  CBMZ  Current Medications: Current Outpatient Medications  Medication Sig Dispense Refill  . [START ON 12/21/2017] amphetamine-dextroamphetamine (ADDERALL XR) 5 MG 24 hr capsule Take 1 capsule (5 mg total) by mouth daily. 30 capsule 0  . [START ON 11/21/2017] amphetamine-dextroamphetamine (ADDERALL XR) 5 MG 24 hr capsule Take 1 capsule (5 mg total) by mouth daily. 30 capsule 0  . amphetamine-dextroamphetamine (ADDERALL XR) 5 MG 24 hr capsule Take 1 capsule (5 mg total) by mouth daily. 30 capsule 0  . clonazePAM (KLONOPIN) 0.5 MG tablet Take 1 tablet (0.5 mg total) by mouth at bedtime. Prn for acute anxiety 30 tablet 0  . fish oil-omega-3 fatty acids 1000 MG capsule Take 2 g by mouth 2 (two) times a week.      . lamoTRIgine (LAMICTAL) 150 MG tablet Take 1 tablet (150 mg total) by mouth 2 (two) times daily. 180 tablet 1  . loratadine (CLARITIN) 10 MG tablet Take 10 mg by mouth daily.      . Multiple Vitamin (MULTIVITAMIN) capsule Take 1 capsule by mouth daily.      . QUEtiapine (SEROQUEL XR) 300 MG 24 hr tablet Take 1 tablet (300 mg total) by mouth at bedtime. 90 tablet 1  . ipratropium (ATROVENT HFA) 17 MCG/ACT inhaler Inhale 2 puffs into the lungs 4 (four) times daily. As needed rescue inhaler (Patient not taking: Reported on 10/22/2017) 1 Inhaler 2  . PROAIR HFA 108 (90 BASE) MCG/ACT inhaler USE 2 PUFFS BY MOUTH FOUR TIMES DAILY AS NEEDED.  SHAKE WELL (Patient not taking: Reported on 10/22/2017) 8.5 each PRN  . SYMBICORT 80-4.5 MCG/ACT inhaler 2 PUFFS TWICE DAILY. RINSE MOUTH AFTER USE. (Patient not taking: Reported on 10/22/2017) 1 Inhaler 5   No current facility-administered medications for this visit.      Musculoskeletal: Strength & Muscle Tone: within normal limits Gait & Station: normal Patient leans: N/A  Psychiatric Specialty Exam: ROS  Blood pressure 122/76, pulse 86, height 5\' 3"  (1.6 m), weight 127 lb (57.6 kg).Body mass index is 22.5 kg/m.  General Appearance:  Casual and Well Groomed  Eye Contact:  Good  Speech:  Clear and Coherent and Normal Rate  Volume:  Normal  Mood:  Euthymic  Affect:  Congruent  Thought Process:  Goal Directed  Orientation:  Full (Time, Place, and Person)  Thought Content: Logical   Suicidal Thoughts:  No  Homicidal Thoughts:  No  Memory:  Immediate;   Good  Judgement:  Good  Insight:  Good  Psychomotor Activity:  Normal  Concentration:  Concentration: Good  Recall:  Good  Fund of Knowledge: Good  Language: Good  Akathisia:  Negative  Handed:  Left  AIMS (  if indicated): not done  Assets:  Communication Skills Desire for Improvement Financial Resources/Insurance Housing  ADL's:  Intact  Cognition: WNL  Sleep:  Good   Screenings:   Assessment and Plan:  Elizabeth Carey is a 42 year old female with bipolar disorder, generally quite high functioning and stable on the current mood stabilizing regimen. She has mild symptoms of inattention and reduced focus that seem to respond well to low dose adderall. No acute safety concerns and she is active engaged in individual therapy.    1. Bipolar affective disorder, current episode hypomanic (HCC)   2. Attention deficit hyperactivity disorder (ADHD), combined type     Status of current problems: stable  Labs Ordered: No orders of the defined types were placed in this encounter.   Labs Reviewed: na  Collateral Obtained/Records Reviewed: na  Plan:  Continue adderall daily for attention/focus Continue lamictal and seroquel for mood stabilization Continue prn clonazepam for anxiety, using sparingly rtc 3 months    Burnard Leigh, MD 10/22/2017, 5:33 PM

## 2017-10-30 ENCOUNTER — Ambulatory Visit (INDEPENDENT_AMBULATORY_CARE_PROVIDER_SITE_OTHER): Payer: BLUE CROSS/BLUE SHIELD | Admitting: Psychology

## 2017-10-30 DIAGNOSIS — F330 Major depressive disorder, recurrent, mild: Secondary | ICD-10-CM

## 2017-11-06 ENCOUNTER — Ambulatory Visit: Payer: BLUE CROSS/BLUE SHIELD | Admitting: Psychology

## 2017-12-26 ENCOUNTER — Ambulatory Visit (INDEPENDENT_AMBULATORY_CARE_PROVIDER_SITE_OTHER): Payer: BLUE CROSS/BLUE SHIELD | Admitting: Psychology

## 2017-12-26 DIAGNOSIS — F330 Major depressive disorder, recurrent, mild: Secondary | ICD-10-CM | POA: Diagnosis not present

## 2018-01-11 DIAGNOSIS — F3181 Bipolar II disorder: Secondary | ICD-10-CM | POA: Diagnosis not present

## 2018-02-08 ENCOUNTER — Ambulatory Visit: Payer: BLUE CROSS/BLUE SHIELD | Admitting: Psychology

## 2018-03-01 DIAGNOSIS — F3181 Bipolar II disorder: Secondary | ICD-10-CM | POA: Diagnosis not present

## 2018-03-25 ENCOUNTER — Ambulatory Visit: Payer: BLUE CROSS/BLUE SHIELD | Admitting: Psychology

## 2018-03-25 DIAGNOSIS — F330 Major depressive disorder, recurrent, mild: Secondary | ICD-10-CM | POA: Diagnosis not present

## 2018-04-18 DIAGNOSIS — F3181 Bipolar II disorder: Secondary | ICD-10-CM | POA: Diagnosis not present

## 2018-05-23 ENCOUNTER — Ambulatory Visit (INDEPENDENT_AMBULATORY_CARE_PROVIDER_SITE_OTHER): Payer: BLUE CROSS/BLUE SHIELD | Admitting: Psychology

## 2018-05-23 DIAGNOSIS — F330 Major depressive disorder, recurrent, mild: Secondary | ICD-10-CM | POA: Diagnosis not present

## 2018-07-12 MED FILL — OSELTAMIVIR PHOSPHATE 75 MG: 75 | 10 days supply | Qty: 10 | Fill #0

## 2018-07-19 ENCOUNTER — Ambulatory Visit: Payer: BLUE CROSS/BLUE SHIELD | Admitting: Psychology

## 2018-07-25 DIAGNOSIS — F3181 Bipolar II disorder: Secondary | ICD-10-CM | POA: Diagnosis not present

## 2018-08-07 ENCOUNTER — Ambulatory Visit (INDEPENDENT_AMBULATORY_CARE_PROVIDER_SITE_OTHER): Payer: BLUE CROSS/BLUE SHIELD | Admitting: Psychology

## 2018-08-07 DIAGNOSIS — F330 Major depressive disorder, recurrent, mild: Secondary | ICD-10-CM

## 2018-09-18 ENCOUNTER — Ambulatory Visit (INDEPENDENT_AMBULATORY_CARE_PROVIDER_SITE_OTHER): Payer: BLUE CROSS/BLUE SHIELD | Admitting: Psychology

## 2018-09-18 DIAGNOSIS — F330 Major depressive disorder, recurrent, mild: Secondary | ICD-10-CM

## 2018-10-04 ENCOUNTER — Ambulatory Visit (INDEPENDENT_AMBULATORY_CARE_PROVIDER_SITE_OTHER): Payer: BLUE CROSS/BLUE SHIELD | Admitting: Psychology

## 2018-10-04 DIAGNOSIS — F330 Major depressive disorder, recurrent, mild: Secondary | ICD-10-CM | POA: Diagnosis not present

## 2018-10-15 ENCOUNTER — Ambulatory Visit (INDEPENDENT_AMBULATORY_CARE_PROVIDER_SITE_OTHER): Payer: BLUE CROSS/BLUE SHIELD | Admitting: Psychology

## 2018-10-15 DIAGNOSIS — F330 Major depressive disorder, recurrent, mild: Secondary | ICD-10-CM

## 2018-11-12 DIAGNOSIS — F3181 Bipolar II disorder: Secondary | ICD-10-CM | POA: Diagnosis not present

## 2018-12-23 DIAGNOSIS — F3181 Bipolar II disorder: Secondary | ICD-10-CM | POA: Diagnosis not present

## 2019-01-20 DIAGNOSIS — F3181 Bipolar II disorder: Secondary | ICD-10-CM | POA: Diagnosis not present

## 2019-02-27 ENCOUNTER — Ambulatory Visit (INDEPENDENT_AMBULATORY_CARE_PROVIDER_SITE_OTHER): Payer: BLUE CROSS/BLUE SHIELD | Admitting: Psychology

## 2019-02-27 DIAGNOSIS — F330 Major depressive disorder, recurrent, mild: Secondary | ICD-10-CM

## 2019-03-24 ENCOUNTER — Ambulatory Visit (INDEPENDENT_AMBULATORY_CARE_PROVIDER_SITE_OTHER): Payer: BLUE CROSS/BLUE SHIELD | Admitting: Psychology

## 2019-03-24 DIAGNOSIS — F330 Major depressive disorder, recurrent, mild: Secondary | ICD-10-CM

## 2019-04-09 DIAGNOSIS — F3181 Bipolar II disorder: Secondary | ICD-10-CM | POA: Diagnosis not present

## 2019-04-21 ENCOUNTER — Ambulatory Visit (INDEPENDENT_AMBULATORY_CARE_PROVIDER_SITE_OTHER): Payer: BLUE CROSS/BLUE SHIELD | Admitting: Psychology

## 2019-04-21 DIAGNOSIS — F330 Major depressive disorder, recurrent, mild: Secondary | ICD-10-CM

## 2019-04-22 DIAGNOSIS — B349 Viral infection, unspecified: Secondary | ICD-10-CM | POA: Diagnosis not present

## 2019-04-22 DIAGNOSIS — Z20828 Contact with and (suspected) exposure to other viral communicable diseases: Secondary | ICD-10-CM | POA: Diagnosis not present

## 2019-05-16 DIAGNOSIS — Z01419 Encounter for gynecological examination (general) (routine) without abnormal findings: Secondary | ICD-10-CM | POA: Diagnosis not present

## 2019-05-21 ENCOUNTER — Ambulatory Visit: Payer: BLUE CROSS/BLUE SHIELD | Admitting: Psychology

## 2019-06-23 ENCOUNTER — Ambulatory Visit (INDEPENDENT_AMBULATORY_CARE_PROVIDER_SITE_OTHER): Payer: BC Managed Care – PPO | Admitting: Psychology

## 2019-06-23 DIAGNOSIS — F330 Major depressive disorder, recurrent, mild: Secondary | ICD-10-CM | POA: Diagnosis not present

## 2019-06-29 DIAGNOSIS — Z20828 Contact with and (suspected) exposure to other viral communicable diseases: Secondary | ICD-10-CM | POA: Diagnosis not present

## 2019-08-12 DIAGNOSIS — F3181 Bipolar II disorder: Secondary | ICD-10-CM | POA: Diagnosis not present

## 2019-08-25 ENCOUNTER — Other Ambulatory Visit: Payer: Self-pay | Admitting: Obstetrics and Gynecology

## 2019-08-25 DIAGNOSIS — Z13220 Encounter for screening for lipoid disorders: Secondary | ICD-10-CM | POA: Diagnosis not present

## 2019-08-25 DIAGNOSIS — Z1231 Encounter for screening mammogram for malignant neoplasm of breast: Secondary | ICD-10-CM

## 2019-08-25 DIAGNOSIS — Z0000 Encounter for general adult medical examination without abnormal findings: Secondary | ICD-10-CM | POA: Diagnosis not present

## 2019-08-27 ENCOUNTER — Other Ambulatory Visit: Payer: BLUE CROSS/BLUE SHIELD

## 2019-09-08 ENCOUNTER — Ambulatory Visit (INDEPENDENT_AMBULATORY_CARE_PROVIDER_SITE_OTHER): Payer: BC Managed Care – PPO | Admitting: Psychology

## 2019-09-08 DIAGNOSIS — F330 Major depressive disorder, recurrent, mild: Secondary | ICD-10-CM

## 2019-09-22 ENCOUNTER — Ambulatory Visit
Admission: RE | Admit: 2019-09-22 | Discharge: 2019-09-22 | Disposition: A | Discharge status: Home / Self Care | Payer: BC Managed Care – PPO | Source: Ambulatory Visit | Attending: Internal Medicine | Admitting: Internal Medicine

## 2019-09-22 ENCOUNTER — Other Ambulatory Visit: Payer: Self-pay | Admitting: Internal Medicine

## 2019-09-22 DIAGNOSIS — M5432 Sciatica, left side: Secondary | ICD-10-CM

## 2019-09-22 DIAGNOSIS — M545 Low back pain: Secondary | ICD-10-CM | POA: Diagnosis not present

## 2019-09-25 ENCOUNTER — Ambulatory Visit
Admission: RE | Admit: 2019-09-25 | Discharge: 2019-09-25 | Disposition: A | Discharge status: Home / Self Care | Payer: BLUE CROSS/BLUE SHIELD | Source: Ambulatory Visit | Attending: Obstetrics and Gynecology | Admitting: Obstetrics and Gynecology

## 2019-09-25 ENCOUNTER — Other Ambulatory Visit: Payer: Self-pay

## 2019-09-25 DIAGNOSIS — Z1231 Encounter for screening mammogram for malignant neoplasm of breast: Secondary | ICD-10-CM | POA: Diagnosis not present

## 2019-09-30 DIAGNOSIS — F3181 Bipolar II disorder: Secondary | ICD-10-CM | POA: Diagnosis not present

## 2019-10-23 ENCOUNTER — Ambulatory Visit (INDEPENDENT_AMBULATORY_CARE_PROVIDER_SITE_OTHER): Payer: BC Managed Care – PPO | Admitting: Psychology

## 2019-10-23 DIAGNOSIS — F330 Major depressive disorder, recurrent, mild: Secondary | ICD-10-CM | POA: Diagnosis not present

## 2019-12-04 ENCOUNTER — Ambulatory Visit (INDEPENDENT_AMBULATORY_CARE_PROVIDER_SITE_OTHER): Payer: BC Managed Care – PPO | Admitting: Psychology

## 2019-12-04 DIAGNOSIS — F330 Major depressive disorder, recurrent, mild: Secondary | ICD-10-CM

## 2019-12-11 DIAGNOSIS — Z1211 Encounter for screening for malignant neoplasm of colon: Secondary | ICD-10-CM | POA: Diagnosis not present

## 2019-12-25 ENCOUNTER — Ambulatory Visit (INDEPENDENT_AMBULATORY_CARE_PROVIDER_SITE_OTHER): Payer: BC Managed Care – PPO | Admitting: Psychology

## 2019-12-25 DIAGNOSIS — F330 Major depressive disorder, recurrent, mild: Secondary | ICD-10-CM | POA: Diagnosis not present

## 2020-01-11 DIAGNOSIS — F3181 Bipolar II disorder: Secondary | ICD-10-CM | POA: Diagnosis not present

## 2020-01-13 DIAGNOSIS — Z20828 Contact with and (suspected) exposure to other viral communicable diseases: Secondary | ICD-10-CM | POA: Diagnosis not present

## 2020-01-21 ENCOUNTER — Ambulatory Visit (INDEPENDENT_AMBULATORY_CARE_PROVIDER_SITE_OTHER): Payer: BC Managed Care – PPO | Admitting: Psychology

## 2020-01-21 DIAGNOSIS — F330 Major depressive disorder, recurrent, mild: Secondary | ICD-10-CM | POA: Diagnosis not present

## 2020-01-30 DIAGNOSIS — Z1159 Encounter for screening for other viral diseases: Secondary | ICD-10-CM | POA: Diagnosis not present

## 2020-02-04 DIAGNOSIS — Q438 Other specified congenital malformations of intestine: Secondary | ICD-10-CM | POA: Diagnosis not present

## 2020-02-04 DIAGNOSIS — Z1211 Encounter for screening for malignant neoplasm of colon: Secondary | ICD-10-CM | POA: Diagnosis not present

## 2020-02-11 ENCOUNTER — Ambulatory Visit: Payer: BC Managed Care – PPO | Admitting: Psychology

## 2020-03-02 ENCOUNTER — Ambulatory Visit (INDEPENDENT_AMBULATORY_CARE_PROVIDER_SITE_OTHER): Payer: BC Managed Care – PPO | Admitting: Psychology

## 2020-03-02 DIAGNOSIS — F330 Major depressive disorder, recurrent, mild: Secondary | ICD-10-CM | POA: Diagnosis not present

## 2020-03-04 DIAGNOSIS — F3181 Bipolar II disorder: Secondary | ICD-10-CM | POA: Diagnosis not present

## 2020-04-20 ENCOUNTER — Ambulatory Visit (INDEPENDENT_AMBULATORY_CARE_PROVIDER_SITE_OTHER): Payer: BC Managed Care – PPO | Admitting: Psychology

## 2020-04-20 DIAGNOSIS — F330 Major depressive disorder, recurrent, mild: Secondary | ICD-10-CM | POA: Diagnosis not present

## 2020-05-05 ENCOUNTER — Other Ambulatory Visit: Payer: BC Managed Care – PPO

## 2020-07-21 ENCOUNTER — Ambulatory Visit (INDEPENDENT_AMBULATORY_CARE_PROVIDER_SITE_OTHER): Payer: 59 | Admitting: Psychology

## 2020-07-21 DIAGNOSIS — F330 Major depressive disorder, recurrent, mild: Secondary | ICD-10-CM | POA: Diagnosis not present

## 2020-08-26 ENCOUNTER — Ambulatory Visit (INDEPENDENT_AMBULATORY_CARE_PROVIDER_SITE_OTHER): Payer: 59 | Admitting: Psychology

## 2020-08-26 DIAGNOSIS — F330 Major depressive disorder, recurrent, mild: Secondary | ICD-10-CM

## 2020-09-02 ENCOUNTER — Ambulatory Visit: Payer: 59 | Admitting: Psychology

## 2020-09-23 ENCOUNTER — Ambulatory Visit (INDEPENDENT_AMBULATORY_CARE_PROVIDER_SITE_OTHER): Payer: 59 | Admitting: Psychology

## 2020-09-23 DIAGNOSIS — F330 Major depressive disorder, recurrent, mild: Secondary | ICD-10-CM

## 2020-10-24 IMAGING — MG DIGITAL SCREENING BILAT W/ TOMO W/ CAD
8 series · 9 of 24 positions shown · non-contrast
Comparison: Previous exam(s).

CLINICAL DATA: Screening.

EXAM:
DIGITAL SCREENING BILATERAL MAMMOGRAM WITH TOMO AND CAD

[R MLO synth-2D]
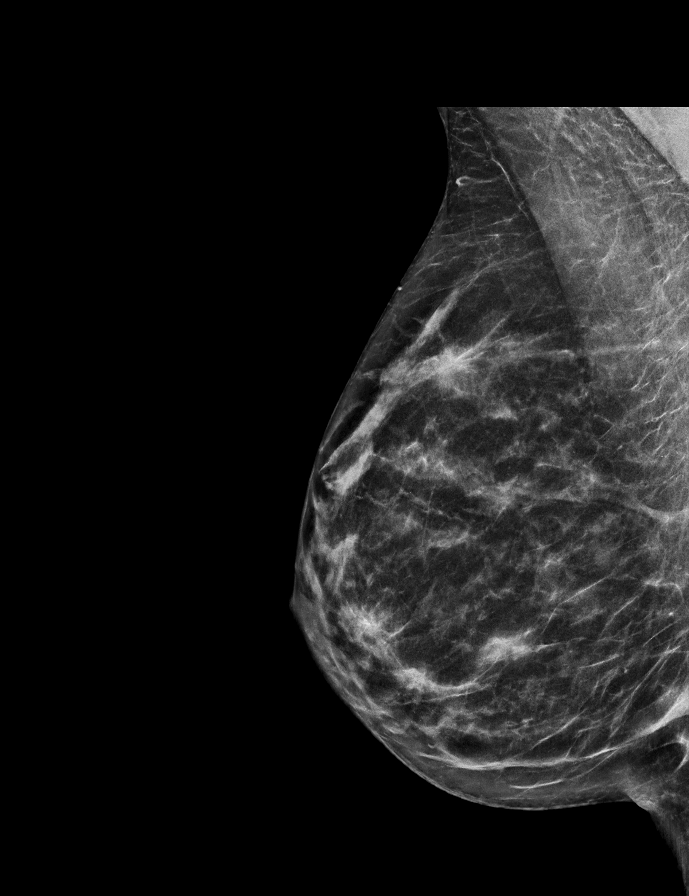

[R CC synth-2D]
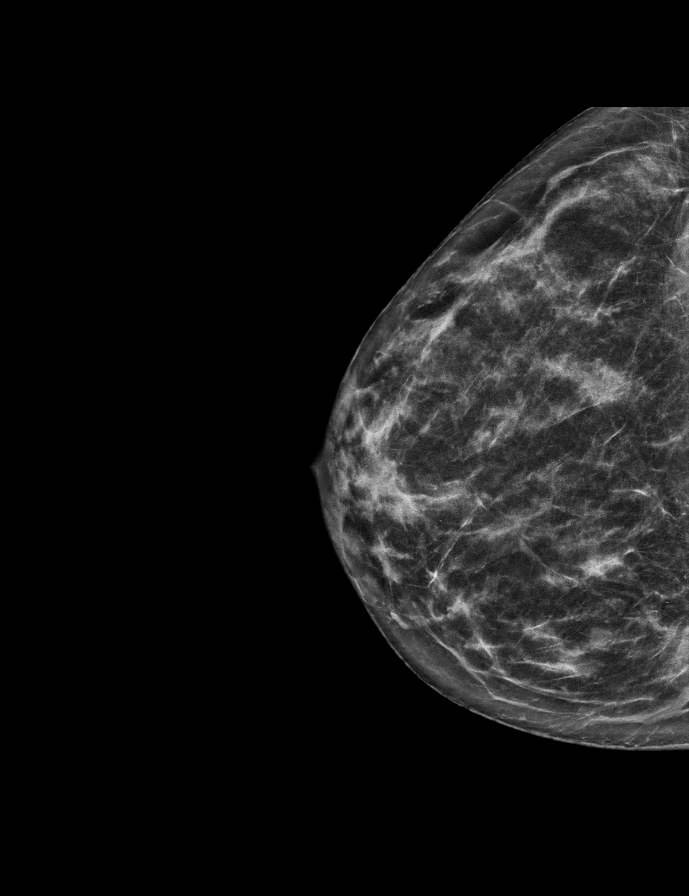

[L CC synth-2D]
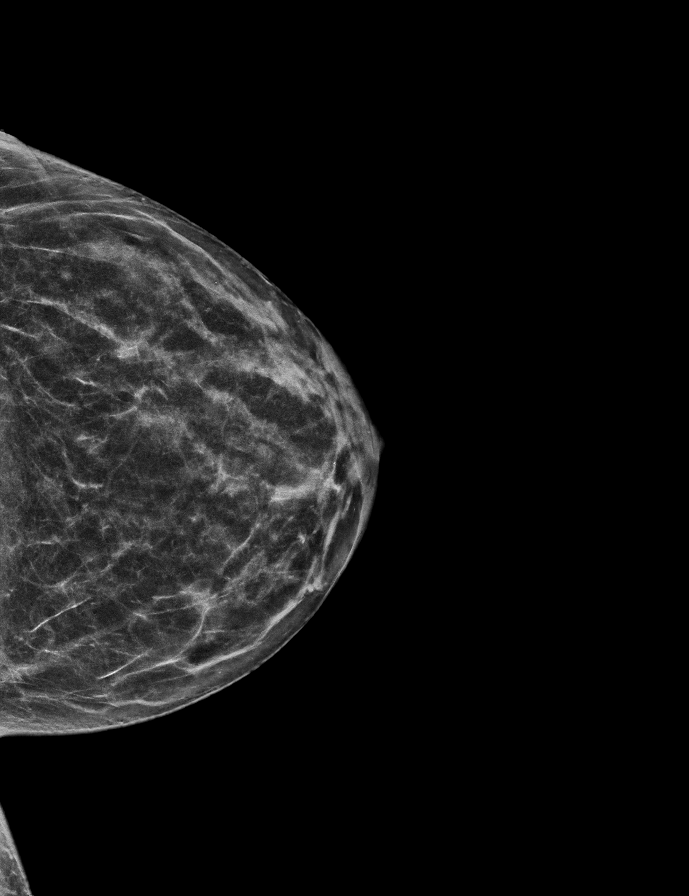

[L MLO synth-2D]
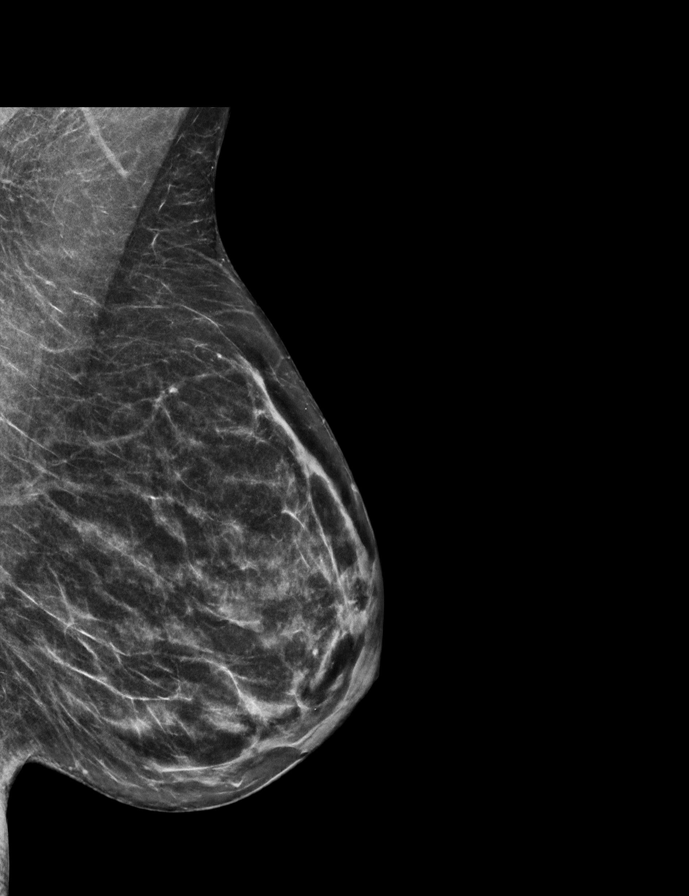

[R CC tomo · 2 of 56 frames shown]
[frame 19/56]
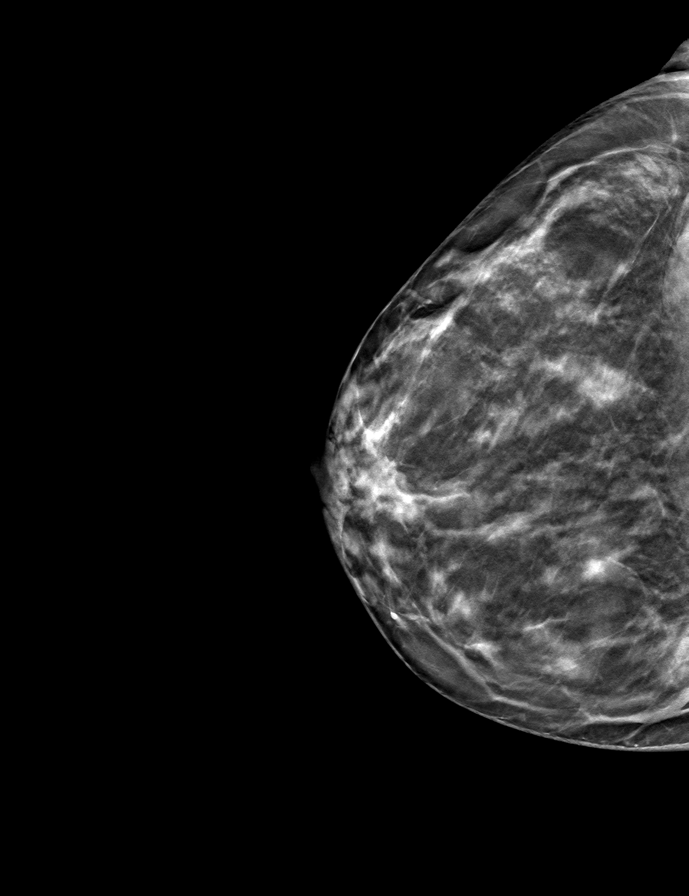
[frame 29/56]
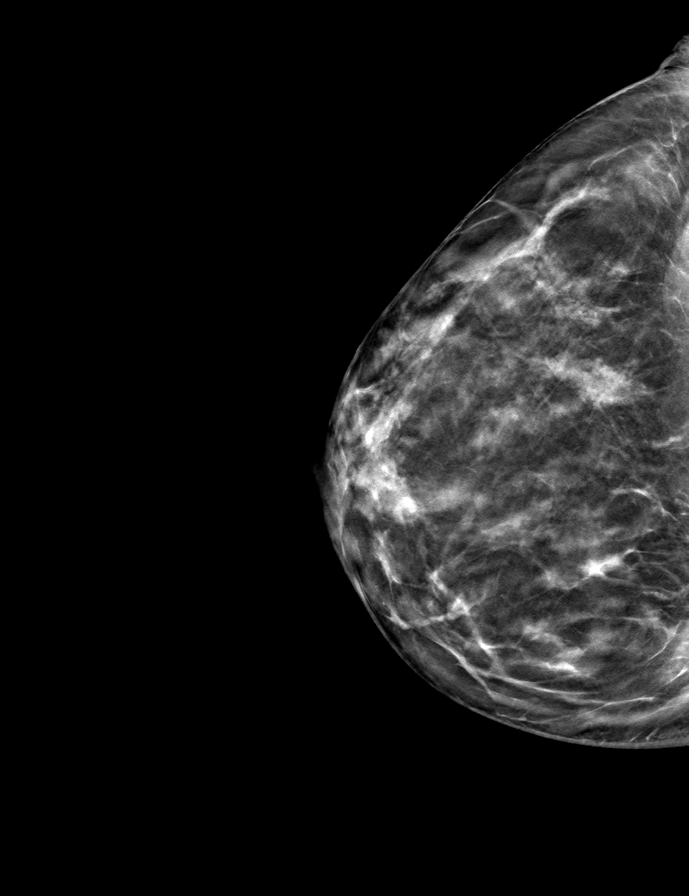

[L MLO tomo · tomo slice 27/54.0]
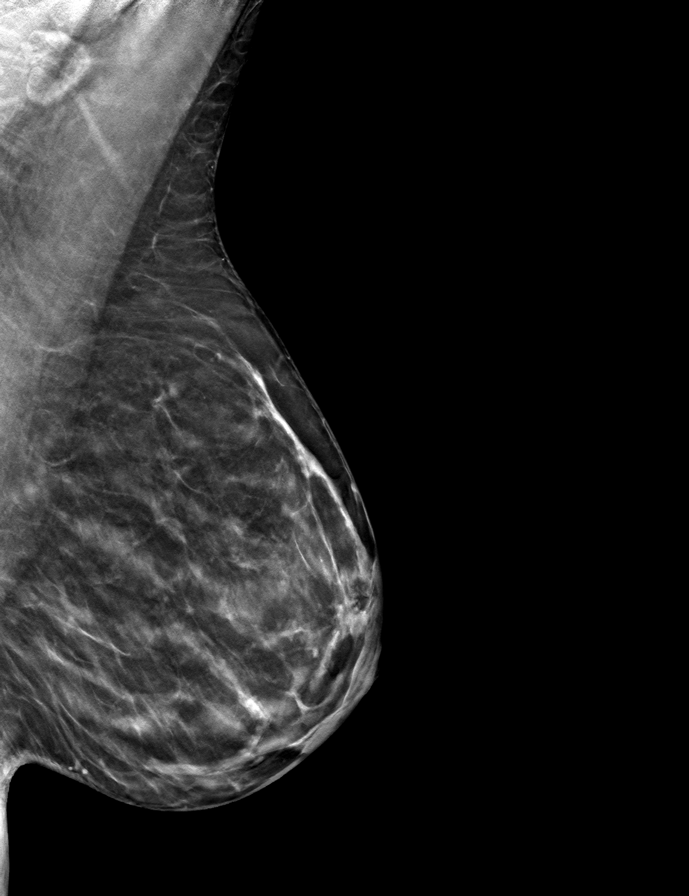

[L CC tomo · tomo slice 27/53.0]
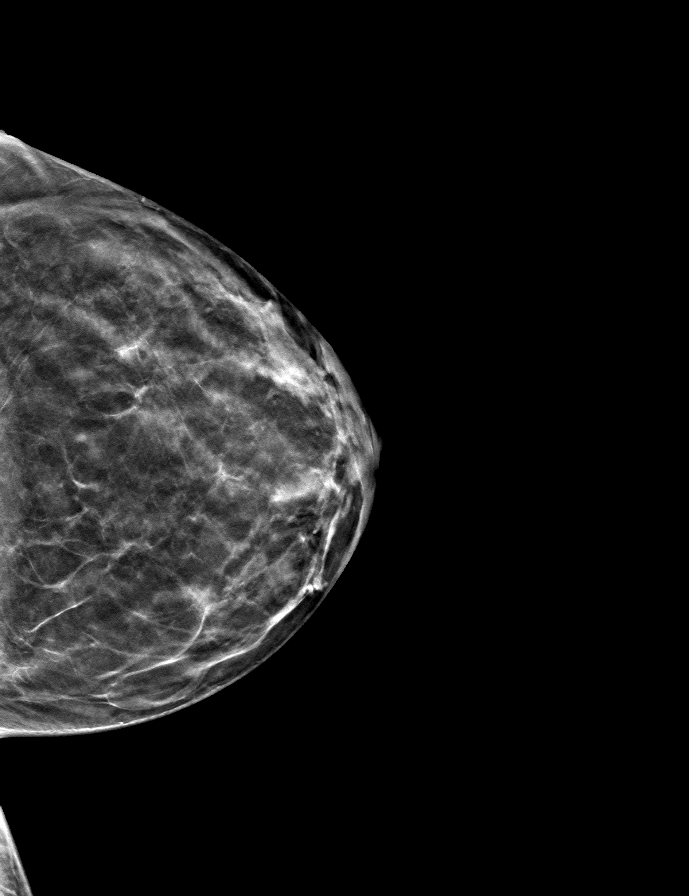

[R MLO tomo · tomo slice 29/58.0]
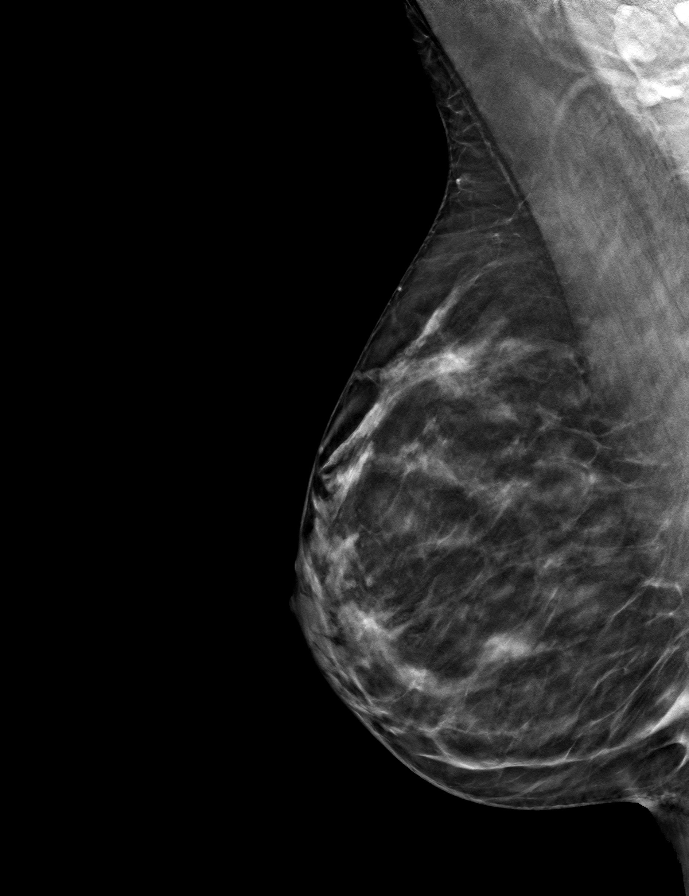

[9 of 24 positions shown; findings below may reference images not displayed]

ACR Breast Density Category c: The breast tissue is heterogeneously
dense, which may obscure small masses.
FINDINGS: There are no findings suspicious for malignancy. Images were
processed with CAD.
IMPRESSION: No mammographic evidence of malignancy. A result letter of this
screening mammogram will be mailed directly to the patient.

RECOMMENDATION:
Screening mammogram in one year. (Code:[id number]-U-LHB)

BI-RADS CATEGORY  1: Negative.

## 2021-07-25 ENCOUNTER — Other Ambulatory Visit: Payer: Self-pay | Admitting: Obstetrics and Gynecology

## 2021-07-25 ENCOUNTER — Other Ambulatory Visit (HOSPITAL_BASED_OUTPATIENT_CLINIC_OR_DEPARTMENT_OTHER): Payer: Self-pay | Admitting: Obstetrics and Gynecology

## 2021-07-25 DIAGNOSIS — Z1231 Encounter for screening mammogram for malignant neoplasm of breast: Secondary | ICD-10-CM

## 2021-08-12 ENCOUNTER — Encounter (HOSPITAL_BASED_OUTPATIENT_CLINIC_OR_DEPARTMENT_OTHER): Payer: Self-pay | Admitting: Radiology

## 2021-08-12 ENCOUNTER — Ambulatory Visit (HOSPITAL_BASED_OUTPATIENT_CLINIC_OR_DEPARTMENT_OTHER)
Admission: RE | Admit: 2021-08-12 | Discharge: 2021-08-12 | Disposition: A | Discharge status: Home / Self Care | Payer: 59 | Source: Ambulatory Visit | Attending: Obstetrics and Gynecology | Admitting: Obstetrics and Gynecology

## 2021-08-12 DIAGNOSIS — Z1231 Encounter for screening mammogram for malignant neoplasm of breast: Secondary | ICD-10-CM | POA: Insufficient documentation

## 2021-12-12 ENCOUNTER — Other Ambulatory Visit: Payer: Self-pay | Admitting: Obstetrics and Gynecology

## 2021-12-12 DIAGNOSIS — N6314 Unspecified lump in the right breast, lower inner quadrant: Secondary | ICD-10-CM

## 2022-01-05 ENCOUNTER — Ambulatory Visit
Admission: RE | Admit: 2022-01-05 | Discharge: 2022-01-05 | Disposition: A | Discharge status: Home / Self Care | Payer: 59 | Source: Ambulatory Visit | Attending: Obstetrics and Gynecology | Admitting: Obstetrics and Gynecology

## 2022-01-05 ENCOUNTER — Ambulatory Visit
Admission: RE | Admit: 2022-01-05 | Discharge: 2022-01-05 | Disposition: A | Discharge status: Home / Self Care | Payer: Commercial Managed Care - HMO | Source: Ambulatory Visit | Attending: Obstetrics and Gynecology | Admitting: Obstetrics and Gynecology

## 2022-01-05 ENCOUNTER — Other Ambulatory Visit: Payer: Self-pay | Admitting: Obstetrics and Gynecology

## 2022-01-05 DIAGNOSIS — N6314 Unspecified lump in the right breast, lower inner quadrant: Secondary | ICD-10-CM

## 2023-02-09 ENCOUNTER — Other Ambulatory Visit: Payer: Self-pay | Admitting: Obstetrics and Gynecology

## 2023-02-09 DIAGNOSIS — Z1231 Encounter for screening mammogram for malignant neoplasm of breast: Secondary | ICD-10-CM

## 2023-02-15 ENCOUNTER — Ambulatory Visit
Admission: RE | Admit: 2023-02-15 | Discharge: 2023-02-15 | Disposition: A | Discharge status: Home / Self Care | Payer: Commercial Managed Care - HMO | Source: Ambulatory Visit | Attending: Obstetrics and Gynecology | Admitting: Obstetrics and Gynecology

## 2023-02-15 DIAGNOSIS — Z1231 Encounter for screening mammogram for malignant neoplasm of breast: Secondary | ICD-10-CM

## 2024-05-23 ENCOUNTER — Other Ambulatory Visit: Payer: Self-pay | Admitting: Internal Medicine

## 2024-05-23 DIAGNOSIS — Z1231 Encounter for screening mammogram for malignant neoplasm of breast: Secondary | ICD-10-CM
